# Patient Record
Sex: Female | Born: 1955 | Race: White | Hispanic: No | Marital: Married
Health system: Southern US, Community
[De-identification: ages and names within clinical notes are randomized; demographics above are authoritative.]

---

## 2004-08-09 ENCOUNTER — Ambulatory Visit: Payer: Self-pay | Admitting: Internal Medicine

## 2004-08-10 ENCOUNTER — Ambulatory Visit: Payer: Self-pay | Admitting: Internal Medicine

## 2004-08-19 ENCOUNTER — Ambulatory Visit: Payer: Self-pay | Admitting: Internal Medicine

## 2004-08-20 ENCOUNTER — Emergency Department: Payer: Self-pay | Admitting: Emergency Medicine

## 2008-11-18 ENCOUNTER — Ambulatory Visit: Payer: Self-pay | Admitting: Gastroenterology

## 2010-01-05 ENCOUNTER — Ambulatory Visit: Payer: Self-pay | Admitting: Rheumatology

## 2010-01-18 ENCOUNTER — Ambulatory Visit: Payer: Self-pay | Admitting: Unknown Physician Specialty

## 2012-02-13 ENCOUNTER — Ambulatory Visit: Payer: Self-pay | Admitting: Emergency Medicine

## 2012-02-13 DIAGNOSIS — E119 Type 2 diabetes mellitus without complications: Secondary | ICD-10-CM

## 2012-02-13 LAB — BASIC METABOLIC PANEL
BUN: 19 mg/dL — ABNORMAL HIGH (ref 7–18)
Calcium, Total: 8.9 mg/dL (ref 8.5–10.1)
Creatinine: 0.7 mg/dL (ref 0.60–1.30)
EGFR (African American): >60
EGFR (Non-African Amer.): >60
Glucose: 205 mg/dL — ABNORMAL HIGH (ref 65–99)
Sodium: 139 mmol/L (ref 136–145)

## 2012-02-15 ENCOUNTER — Ambulatory Visit: Payer: Self-pay | Admitting: Emergency Medicine

## 2012-02-16 LAB — PATHOLOGY REPORT

## 2012-09-17 ENCOUNTER — Ambulatory Visit: Payer: Self-pay | Admitting: Bariatrics

## 2012-09-18 ENCOUNTER — Other Ambulatory Visit: Payer: Self-pay | Admitting: Bariatrics

## 2012-09-18 LAB — CBC WITH DIFFERENTIAL/PLATELET
Basophil #: 0.1 10*3/uL (ref 0.0–0.1)
Basophil %: 0.7 %
Eosinophil %: 1.6 %
HCT: 41.3 % (ref 35.0–47.0)
HGB: 14 g/dL (ref 12.0–16.0)
Lymphocyte #: 2.3 10*3/uL (ref 1.0–3.6)
MCH: 30 pg (ref 26.0–34.0)
MCHC: 33.8 g/dL (ref 32.0–36.0)
Monocyte #: 0.4 x10 3/mm (ref 0.2–0.9)
Monocyte %: 5.3 %
Neutrophil %: 64.8 %
Platelet: 261 10*3/uL (ref 150–440)
RBC: 4.65 10*6/uL (ref 3.80–5.20)

## 2012-09-18 LAB — APTT: Activated PTT: 30.5 secs (ref 23.6–35.9)

## 2012-09-18 LAB — PHOSPHORUS: Phosphorus: 3.9 mg/dL (ref 2.5–4.9)

## 2012-09-18 LAB — COMPREHENSIVE METABOLIC PANEL
Alkaline Phosphatase: 179 U/L — ABNORMAL HIGH (ref 50–136)
Anion Gap: 4 — ABNORMAL LOW (ref 7–16)
Chloride: 100 mmol/L (ref 98–107)
Co2: 29 mmol/L (ref 21–32)
Glucose: 282 mg/dL — ABNORMAL HIGH (ref 65–99)
Potassium: 4.5 mmol/L (ref 3.5–5.1)
Sodium: 133 mmol/L — ABNORMAL LOW (ref 136–145)

## 2012-09-18 LAB — IRON AND TIBC
Iron Saturation: 19 %
Iron: 67 ug/dL (ref 50–170)
Unbound Iron-Bind.Cap.: 286 ug/dL

## 2012-09-18 LAB — FERRITIN: Ferritin (ARMC): 179 ng/mL (ref 8–388)

## 2012-09-18 LAB — FOLATE: Folic Acid: 9.9 ng/mL (ref 3.1–100.0)

## 2012-09-18 LAB — HEMOGLOBIN A1C: Hemoglobin A1C: 10.6 % — ABNORMAL HIGH (ref 4.2–6.3)

## 2012-09-18 LAB — PROTIME-INR: INR: 0.9

## 2012-09-26 ENCOUNTER — Other Ambulatory Visit: Payer: Self-pay | Admitting: *Deleted

## 2012-09-26 ENCOUNTER — Ambulatory Visit: Payer: Self-pay | Admitting: Bariatrics

## 2012-09-26 DIAGNOSIS — K219 Gastro-esophageal reflux disease without esophagitis: Secondary | ICD-10-CM

## 2012-09-27 ENCOUNTER — Other Ambulatory Visit: Payer: Self-pay | Admitting: Bariatrics

## 2012-09-27 DIAGNOSIS — K219 Gastro-esophageal reflux disease without esophagitis: Secondary | ICD-10-CM

## 2012-09-30 ENCOUNTER — Ambulatory Visit
Admission: RE | Admit: 2012-09-30 | Discharge: 2012-09-30 | Disposition: A | Discharge status: Home / Self Care | Payer: 59 | Source: Ambulatory Visit | Attending: *Deleted | Admitting: *Deleted

## 2012-09-30 DIAGNOSIS — K219 Gastro-esophageal reflux disease without esophagitis: Secondary | ICD-10-CM

## 2013-12-03 IMAGING — MG MM BREAST SURGICAL SPECIMEN
1 series · 1 of 1 positions shown · non-contrast
Comparison: none

REASON FOR EXAM: ROUTINE
COMMENTS:

[R CC]
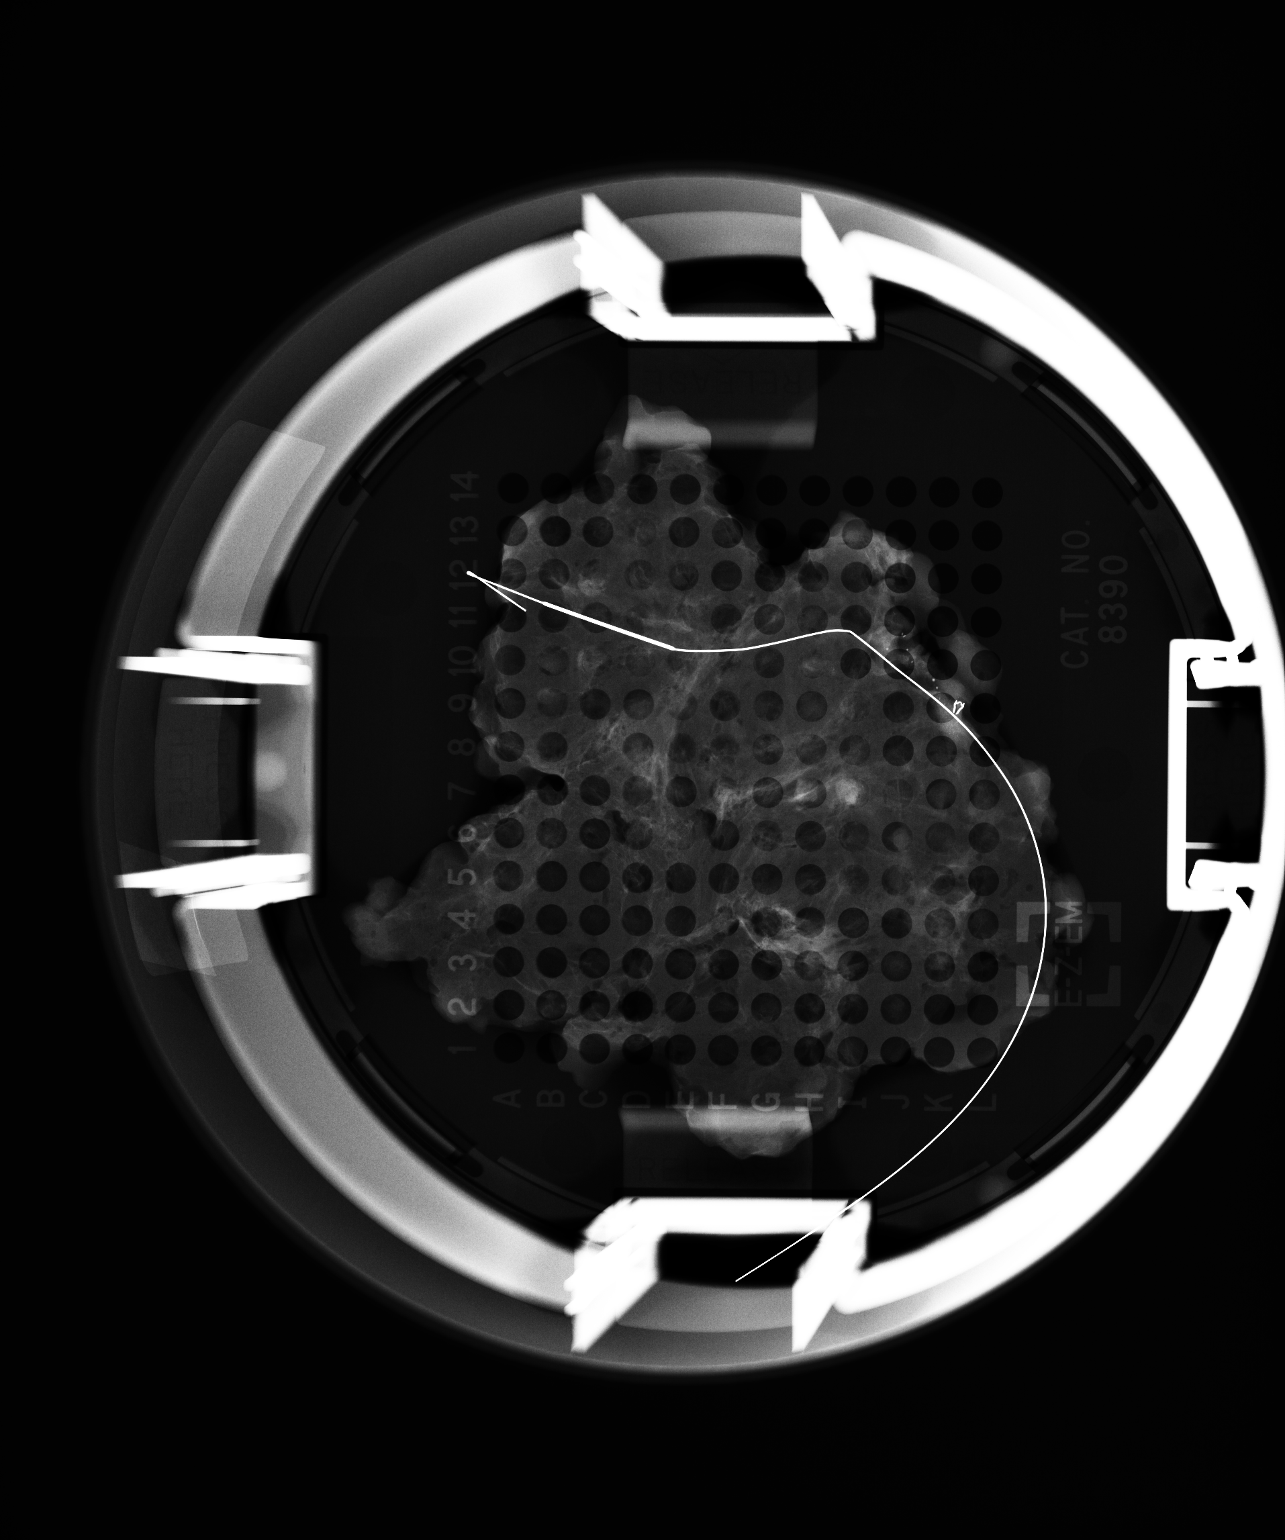

[1 of 1 positions shown; findings below may reference images not displayed]

PROCEDURE:     MAM - MAM BREAST SPECIMEN  - February 15, 2012  [DATE]

RESULT:     Findings: Single specimen radiograph from excisional biopsy is
provided. The excisional biopsy radiograph demonstrates the nodule and
biopsy clip within the periphery of the specimen along with the wire.
Correlate with pathology results.
IMPRESSION: Please see above.

## 2014-07-15 IMAGING — CR DG CHEST 2V
1 series · 2 of 2 positions shown · non-contrast
Comparison: none

REASON FOR EXAM: morbid obesity
COMMENTS:

PROCEDURE:     DXR - DXR CHEST PA (OR AP) AND LATERAL  - September 26, 2012  [DATE]
RESULT:     The lungs are clear. The heart and pulmonary vessels are normal.
The bony and mediastinal structures are unremarkable. There is no effusion.
There is no pneumothorax or evidence of congestive failure.

[Series 1: w chest pa · 0.14mm/px · 2 of 2 slices shown]
[im 1/2]
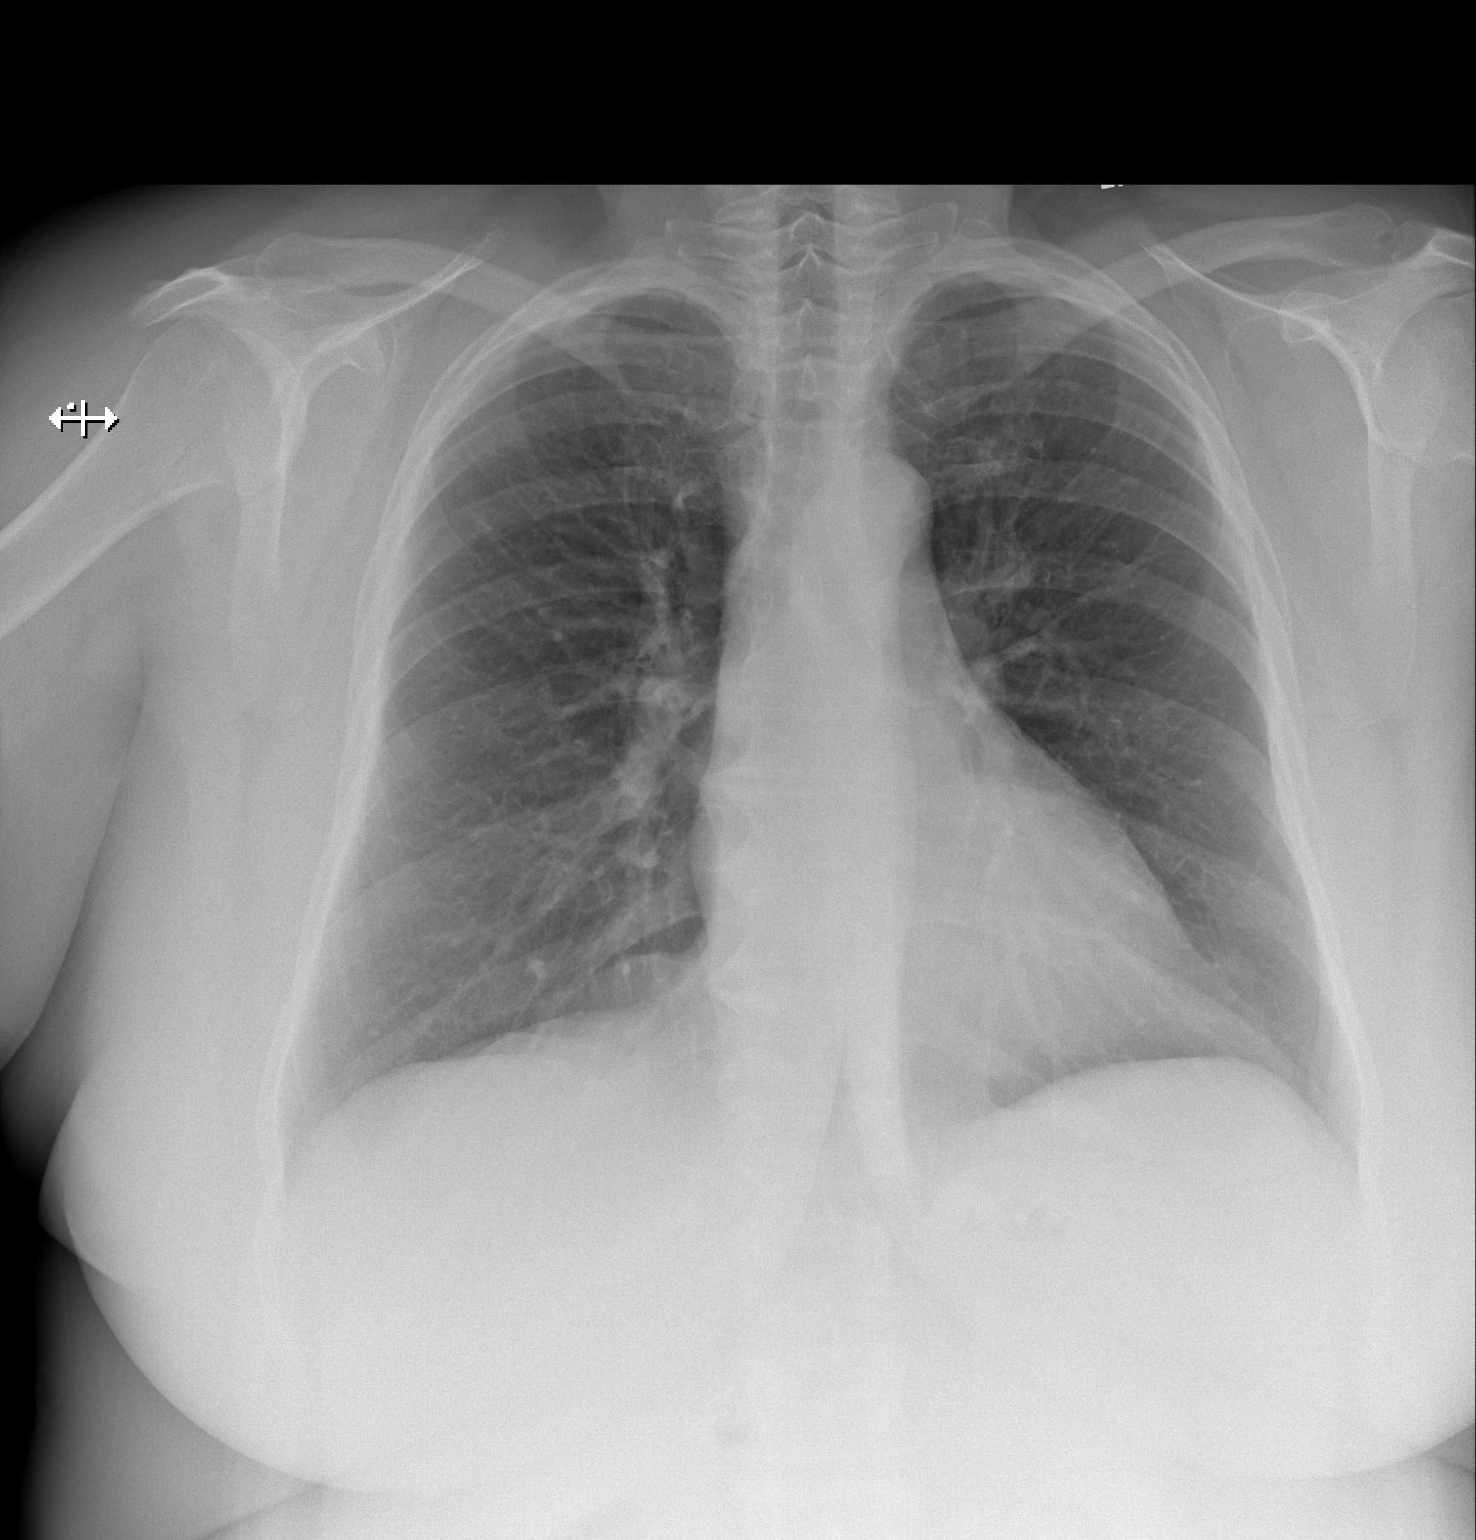
[im 2/2]
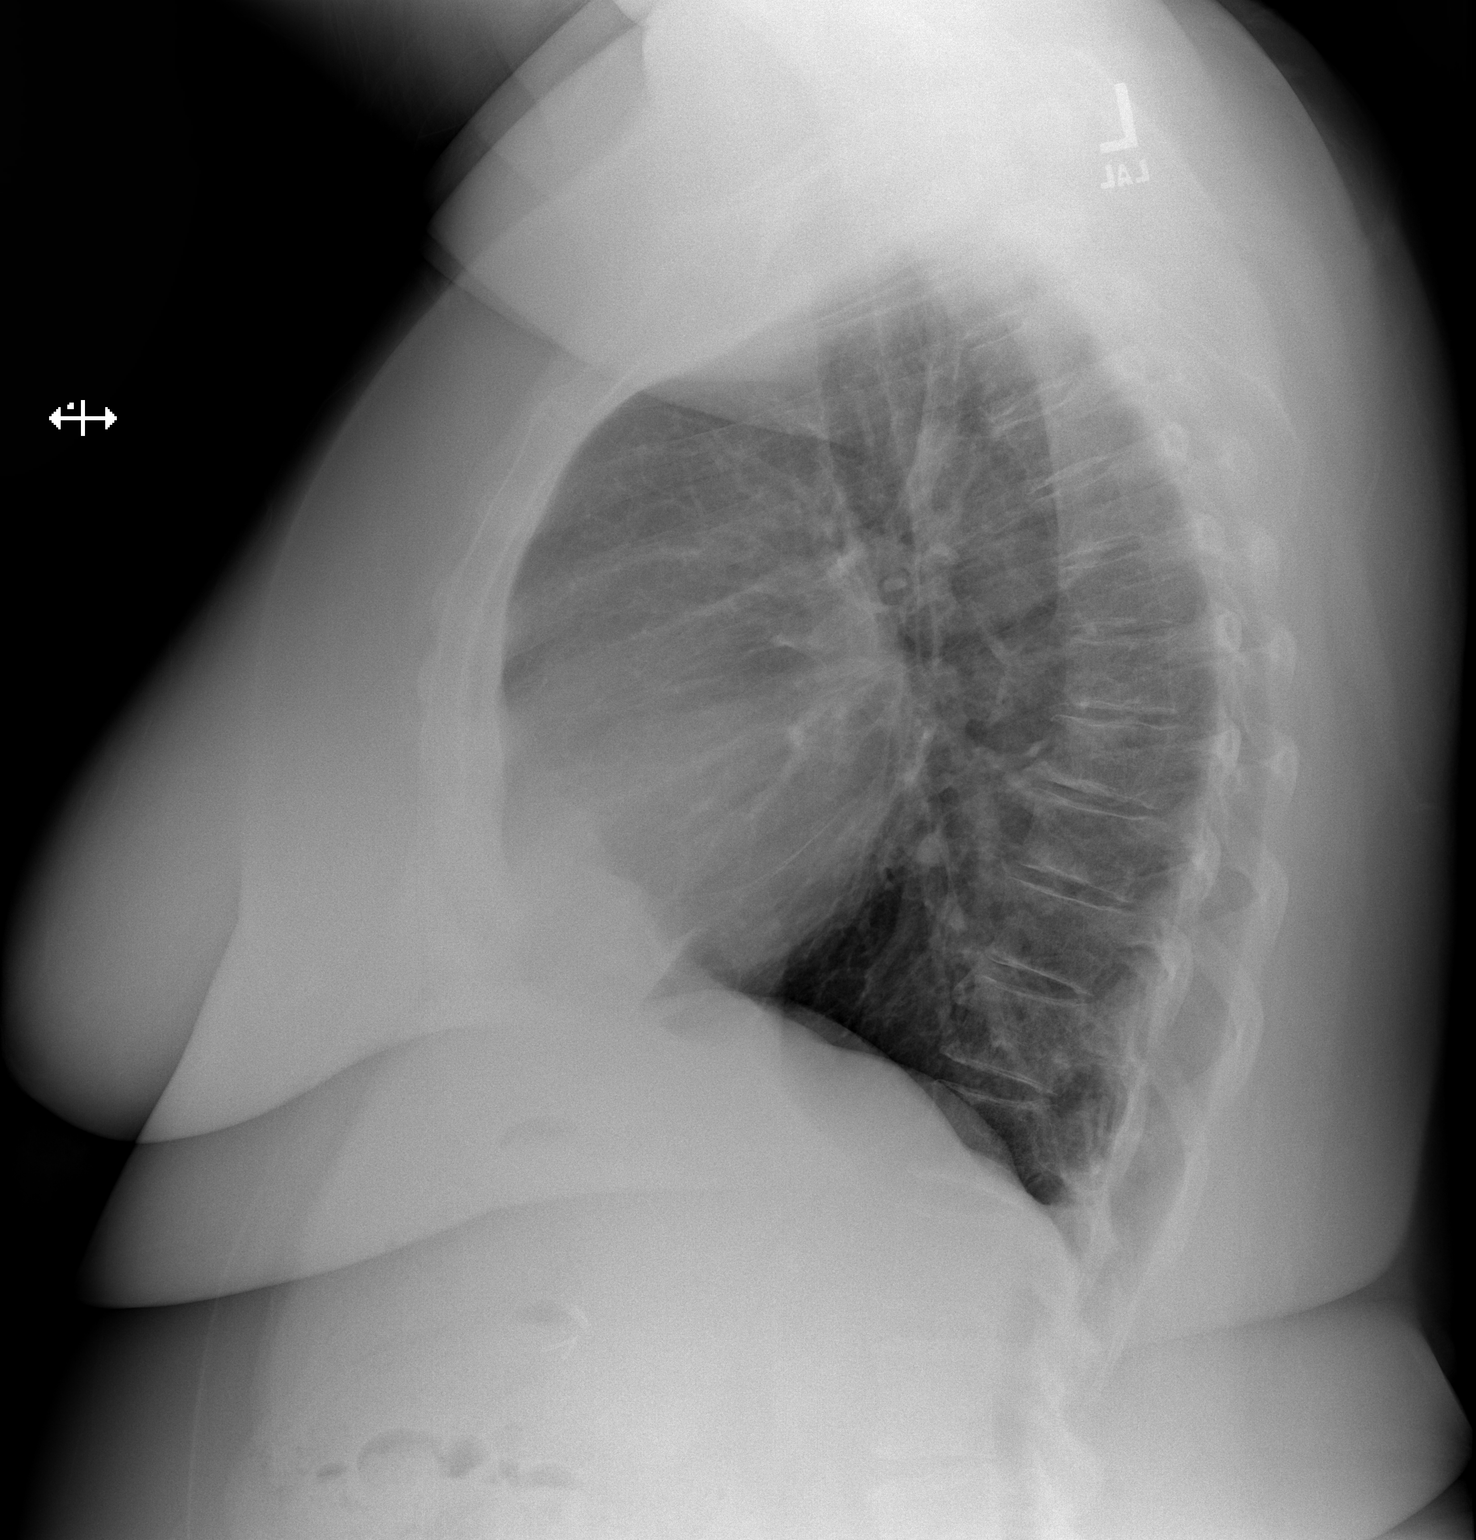

[2 of 2 positions shown; findings below may reference images not displayed]

IMPRESSION: No acute cardiopulmonary disease.

[REDACTED]

## 2014-07-19 IMAGING — RF DG UGI W/ HIGH DENSITY W/KUB
18 of 24 series · 18 of 24 positions shown · non-contrast
Comparison: None.

CLINICAL DATA: Esophageal reflux.  Preop bariatric surgery.

UPPER GI SERIES W/HIGH DENSITY W/KUB
TECHNIQUE: After obtaining a scout radiograph, upper GI series
performed with high density barium and effervescent agent. Thin
barium also used.
Fluoroscopy Time: 1 minute 42 seconds.

[Series 1: run · 1 of 1 slices shown (1 of 17)]
[im 1/1]
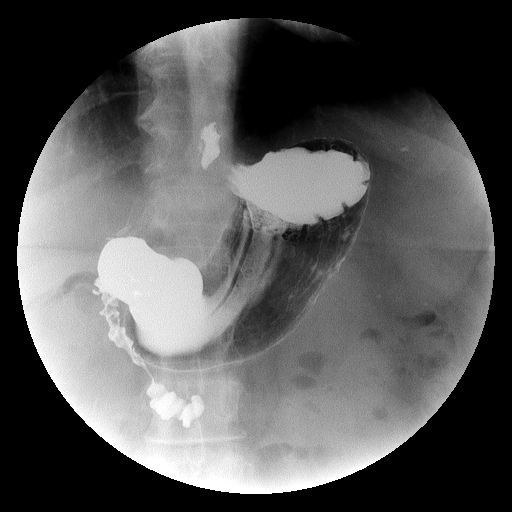

[Series 3: run · 1 of 1 slices shown (2 of 17)]
[im 1/1]
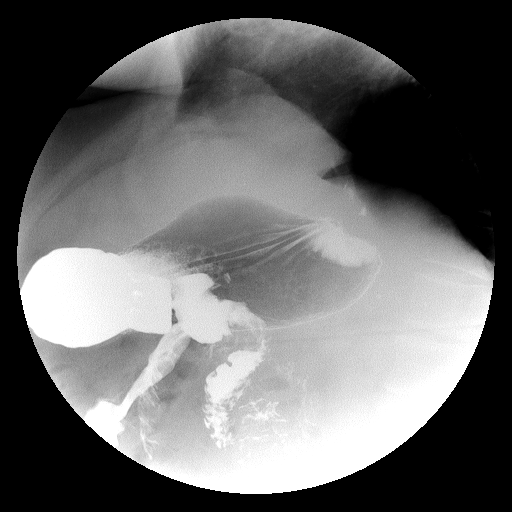

[Series 4: run · 1 of 1 slices shown (3 of 17)]
[im 1/1]
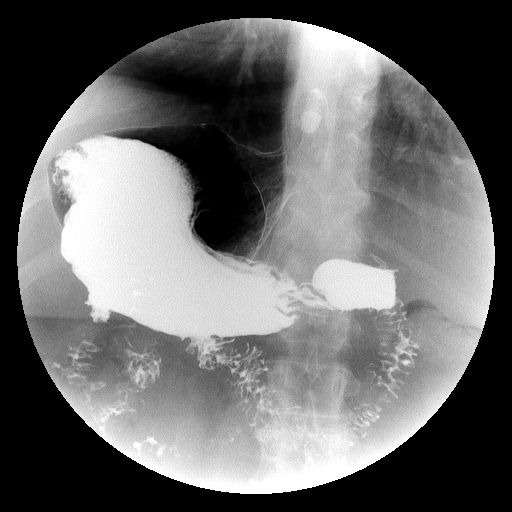

[Series 5: run · 1 of 1 slices shown (4 of 17)]
[im 1/1]
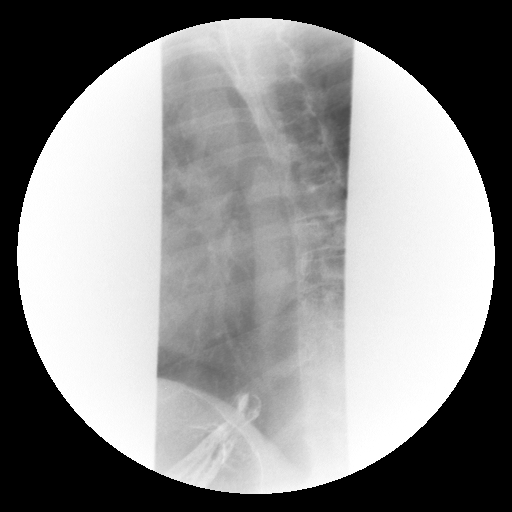

[Series 7: run · 1 of 1 slices shown (5 of 17)]
[im 1/1]
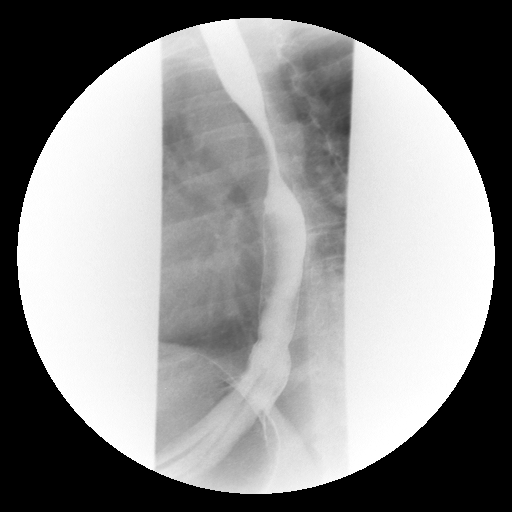

[Series 8: run · 1 of 1 slices shown (6 of 17)]
[im 1/1]
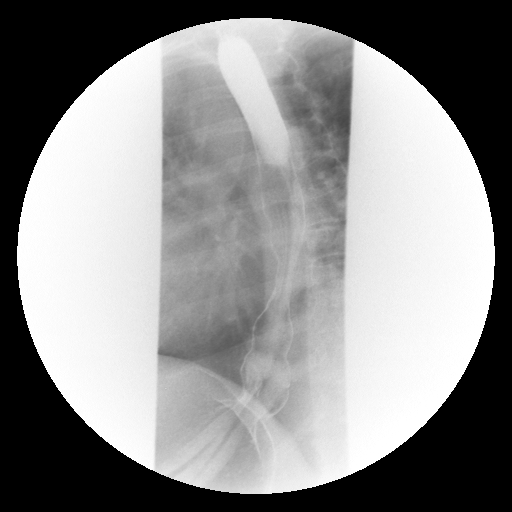

[Series 9: run · 1 of 1 slices shown (7 of 17)]
[im 1/1]
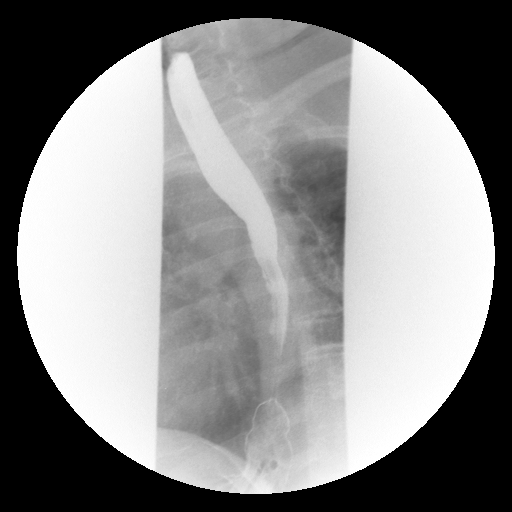

[Series 11: run · 1 of 1 slices shown (8 of 17)]
[im 1/1]
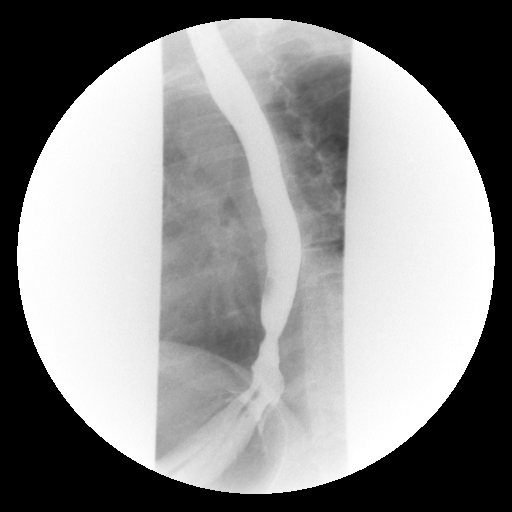

[Series 12: run · 1 of 1 slices shown (9 of 17)]
[im 1/1]
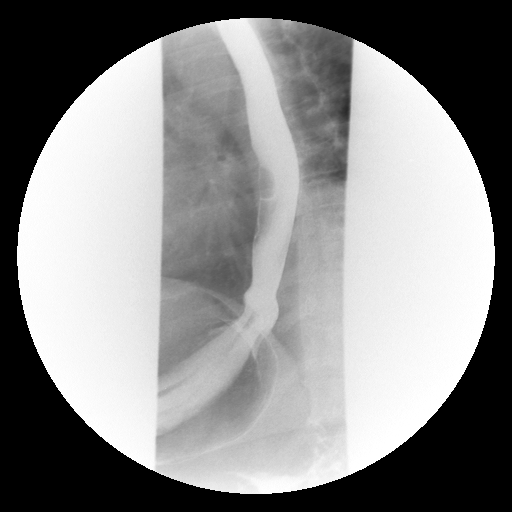

[Series 13: run · 1 of 1 slices shown (10 of 17)]
[im 1/1]
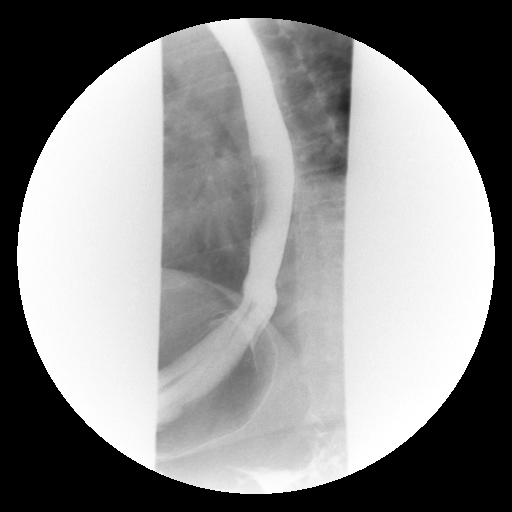

[Series 15: run · 1 of 1 slices shown (11 of 17)]
[im 1/1]
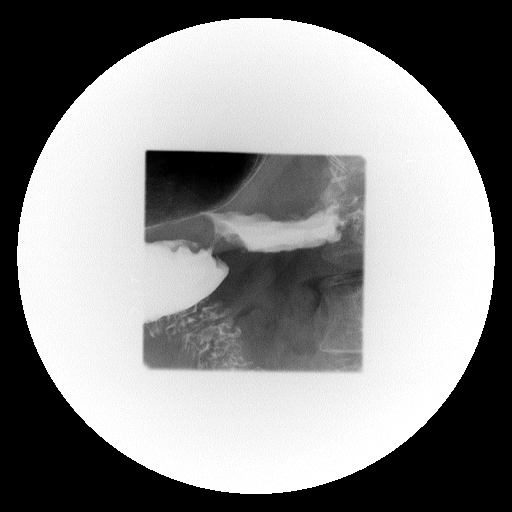

[Series 16: run · 1 of 1 slices shown (12 of 17)]
[im 1/1]
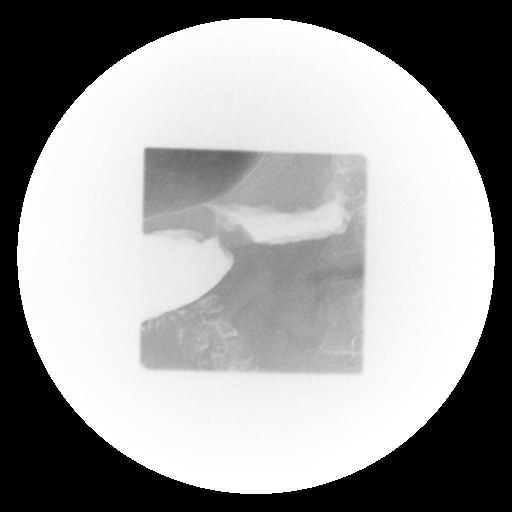

[Series 17: run · 1 of 1 slices shown (13 of 17)]
[im 1/1]
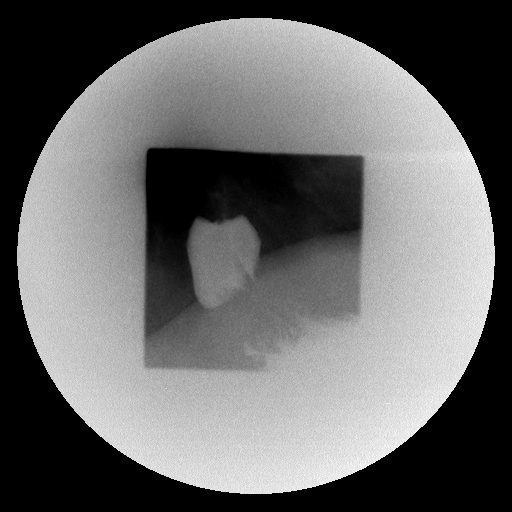

[Series 19: run · 1 of 1 slices shown (14 of 17)]
[im 1/1]
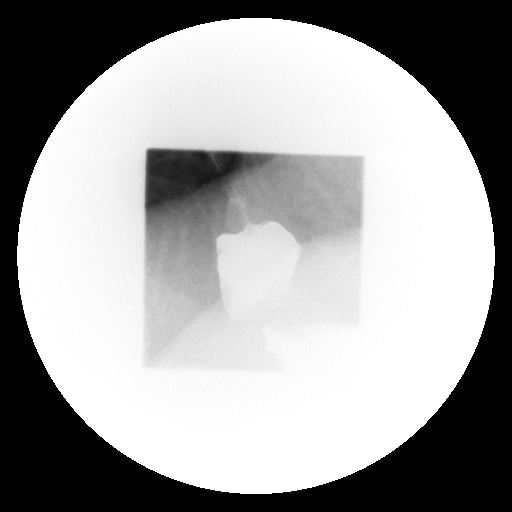

[Series 20: run · 1 of 1 slices shown (15 of 17)]
[im 1/1]
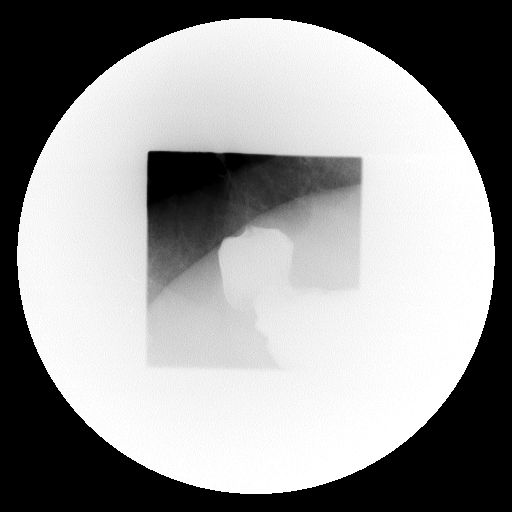

[Series 21: run · 1 of 1 slices shown (16 of 17)]
[im 1/1]
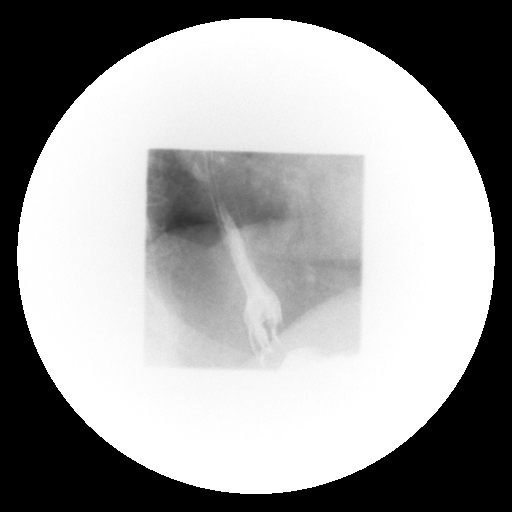

[Series 23: run · 1 of 1 slices shown (17 of 17)]
[im 1/1]
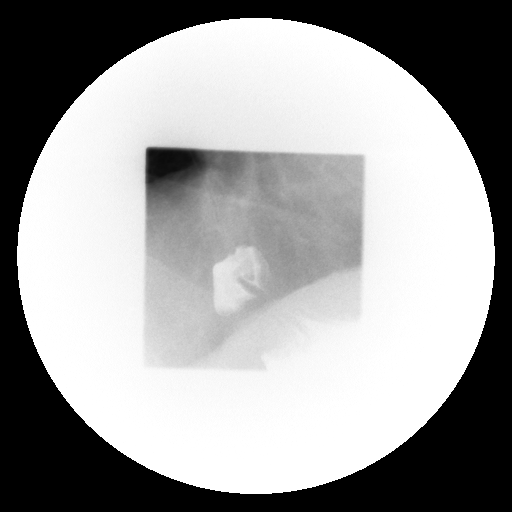

[Series 1001: view not recorded · 0.20mm/px · 1 of 1 slices shown]
[im 1/1]
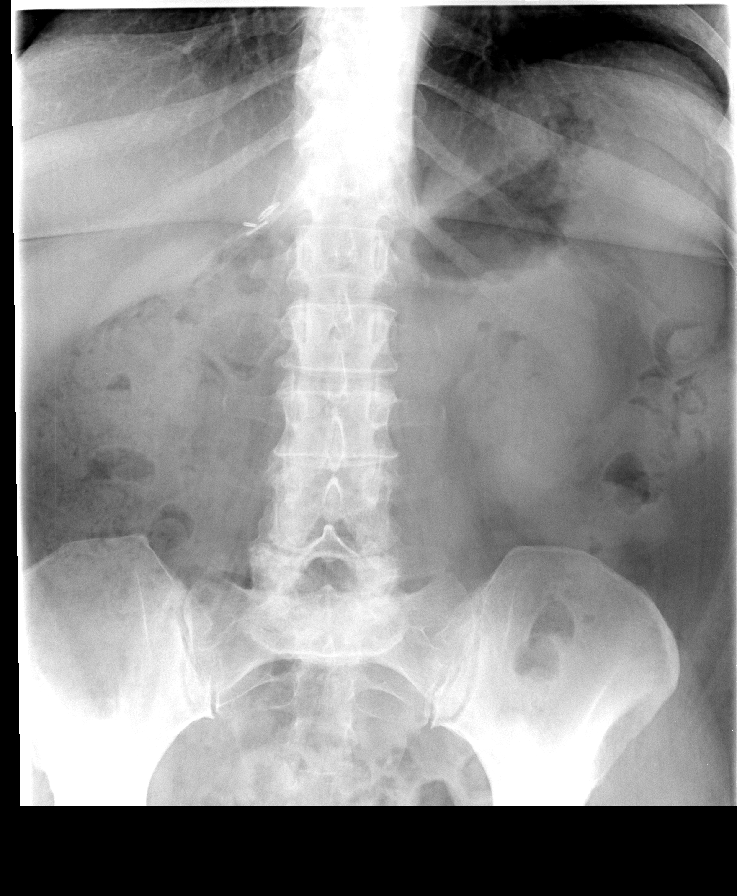

[18 of 24 positions shown; findings below may reference images not displayed]

FINDINGS: Scout view of the abdomen shows a fair amount of stool in
the colon.  No unexpected radiopaque calculi.  Surgical clips are
seen in the right upper quadrant.

Double contrast examination of the upper gastrointestinal tract
shows occasionally sluggish esophageal motility.  No esophageal
fold thickening, stricture or obstruction.  Tiny hiatal hernia.
Stomach and duodenal bulb are normal.
IMPRESSION: 1.  Occasionally sluggish esophageal motility.
2.  Tiny hiatal hernia.
3.  Question constipation.

## 2014-07-28 NOTE — Op Note (Signed)
PATIENT NAME:  Janet Reyes, Janet Reyes MR#:  [id number] DATE OF BIRTH:  1955/11/02  DATE OF PROCEDURE:  02/15/2012  PREOPERATIVE DIAGNOSIS: Mass in the right breast with microcalcification.  POSTOPERATIVE DIAGNOSIS: Mass in the right breast with microcalcification.  OPERATION: Right breast needle localization biopsy.   SURGEON: Halen Antenucci S. Eldra Word, MD   INDICATION: This is a patient who had a mass in the right breast for which she had a biopsy done last year with stereotactic biopsy which was negative. Mammogram showed the same mass as a little bit more calcification so the patient wanted this mass out. She did not want to wait. She was then brought to surgery.   PROCEDURE: Under general anesthesia the right breast was then prepped and draped. An incision was made the near the wire. After cutting skin and subcutaneous tissue, the wire was going way down deep in the breast. The patient had a very huge breast. Dissection was done to stay away from the wire and the whole wire and the breast tissue was then excised. It was then sent to x-ray and showed the calcification. Bleeding was stopped with the Bovie. Wound was then closed in layers with 3-0 Vicryl and 4-0 Vicryl sutures. Steri-Strips and glue was applied on the skin. The patient tolerated the procedure well and was sent to the recovery room in satisfactory condition.   ____________________________ Alton RevereMasud S. Cecelia ByarsHashmi, MD [id number] D: 02/15/2012 14:03:25 ET T: 02/15/2012 14:33:40 ET JOB#: [id number]  cc: Jamille Fisher S. Cecelia ByarsHashmi, MD, <Dictator> Silas FloodSheikh A. Ellsworth Lennoxejan-Sie, MD Meryle ReadyMASUD S Steffany Schoenfelder MD ELECTRONICALLY SIGNED 02/16/2012 15:10
